# Patient Record
Sex: Male | Born: 1935 | Race: White | Hispanic: No | Marital: Married
Health system: Southern US, Community
[De-identification: ages and names within clinical notes are randomized; demographics above are authoritative.]

---

## 1998-07-14 ENCOUNTER — Other Ambulatory Visit: Admission: RE | Admit: 1998-07-14 | Discharge: 1998-07-14 | Payer: Self-pay | Admitting: *Deleted

## 1999-06-25 ENCOUNTER — Other Ambulatory Visit: Admission: RE | Admit: 1999-06-25 | Discharge: 1999-06-25 | Payer: Self-pay | Admitting: Urology

## 2002-01-11 ENCOUNTER — Ambulatory Visit (HOSPITAL_COMMUNITY): Admission: RE | Admit: 2002-01-11 | Discharge: 2002-01-12 | Payer: Self-pay | Admitting: Cardiology

## 2002-01-12 ENCOUNTER — Encounter: Payer: Self-pay | Admitting: Cardiology

## 2003-01-13 ENCOUNTER — Ambulatory Visit (HOSPITAL_COMMUNITY): Admission: RE | Admit: 2003-01-13 | Discharge: 2003-01-13 | Payer: Self-pay | Admitting: Neurology

## 2003-02-05 ENCOUNTER — Encounter (INDEPENDENT_AMBULATORY_CARE_PROVIDER_SITE_OTHER): Payer: Self-pay | Admitting: Specialist

## 2003-02-05 ENCOUNTER — Ambulatory Visit (HOSPITAL_COMMUNITY): Admission: RE | Admit: 2003-02-05 | Discharge: 2003-02-05 | Payer: Self-pay | Admitting: *Deleted

## 2005-01-10 ENCOUNTER — Encounter (INDEPENDENT_AMBULATORY_CARE_PROVIDER_SITE_OTHER): Payer: Self-pay | Admitting: Specialist

## 2005-01-10 ENCOUNTER — Ambulatory Visit (HOSPITAL_COMMUNITY): Admission: RE | Admit: 2005-01-10 | Discharge: 2005-01-10 | Payer: Self-pay | Admitting: Urology

## 2005-01-10 ENCOUNTER — Ambulatory Visit (HOSPITAL_BASED_OUTPATIENT_CLINIC_OR_DEPARTMENT_OTHER): Admission: RE | Admit: 2005-01-10 | Discharge: 2005-01-10 | Payer: Self-pay | Admitting: Urology

## 2005-09-15 ENCOUNTER — Emergency Department (HOSPITAL_COMMUNITY): Admission: EM | Admit: 2005-09-15 | Discharge: 2005-09-16 | Payer: Self-pay | Admitting: Emergency Medicine

## 2005-12-30 ENCOUNTER — Emergency Department (HOSPITAL_COMMUNITY): Admission: EM | Admit: 2005-12-30 | Discharge: 2005-12-30 | Payer: Self-pay | Admitting: Emergency Medicine

## 2009-07-16 ENCOUNTER — Ambulatory Visit (HOSPITAL_COMMUNITY): Admission: RE | Admit: 2009-07-16 | Discharge: 2009-07-16 | Payer: Self-pay | Admitting: Cardiology

## 2010-01-01 ENCOUNTER — Encounter: Admission: RE | Admit: 2010-01-01 | Discharge: 2010-01-01 | Payer: Self-pay | Admitting: Family Medicine

## 2010-01-04 ENCOUNTER — Ambulatory Visit: Payer: Self-pay | Admitting: Oncology

## 2010-01-07 LAB — CBC WITH DIFFERENTIAL/PLATELET
Basophils Absolute: 0 10*3/uL (ref 0.0–0.1)
EOS%: 3.7 % (ref 0.0–7.0)
Eosinophils Absolute: 0.1 10*3/uL (ref 0.0–0.5)
HGB: 12.8 g/dL — ABNORMAL LOW (ref 13.0–17.1)
LYMPH%: 16.7 % (ref 14.0–49.0)
MONO#: 0.5 10*3/uL (ref 0.1–0.9)
NEUT#: 2.6 10*3/uL (ref 1.5–6.5)
NEUT%: 65.2 % (ref 39.0–75.0)
RDW: 12.3 % (ref 11.0–14.6)
WBC: 4 10*3/uL (ref 4.0–10.3)

## 2010-01-07 LAB — CHCC SMEAR

## 2010-01-11 LAB — COMPREHENSIVE METABOLIC PANEL
Alkaline Phosphatase: 110 U/L (ref 39–117)
Creatinine, Ser: 0.89 mg/dL (ref 0.40–1.50)
Potassium: 3.9 mEq/L (ref 3.5–5.3)
Sodium: 139 mEq/L (ref 135–145)
Total Bilirubin: 0.6 mg/dL (ref 0.3–1.2)
Total Protein: 6.2 g/dL (ref 6.0–8.3)

## 2010-01-11 LAB — SPEP & IFE WITH QIG
Albumin ELP: 53.3 % — ABNORMAL LOW (ref 55.8–66.1)
Alpha-1-Globulin: 6.8 % — ABNORMAL HIGH (ref 2.9–4.9)
Beta 2: 5.7 % (ref 3.2–6.5)
Gamma Globulin: 13.2 % (ref 11.1–18.8)

## 2010-01-11 LAB — LACTATE DEHYDROGENASE: LDH: 340 U/L — ABNORMAL HIGH (ref 94–250)

## 2010-01-15 ENCOUNTER — Ambulatory Visit (HOSPITAL_COMMUNITY): Admission: RE | Admit: 2010-01-15 | Discharge: 2010-01-15 | Payer: Self-pay | Admitting: Oncology

## 2010-02-22 ENCOUNTER — Ambulatory Visit (HOSPITAL_COMMUNITY): Admission: RE | Admit: 2010-02-22 | Discharge: 2010-02-22 | Payer: Self-pay | Admitting: Oncology

## 2010-02-22 ENCOUNTER — Ambulatory Visit: Payer: Self-pay | Admitting: Oncology

## 2010-02-25 ENCOUNTER — Ambulatory Visit: Payer: Self-pay | Admitting: Oncology

## 2010-04-12 ENCOUNTER — Encounter (INDEPENDENT_AMBULATORY_CARE_PROVIDER_SITE_OTHER): Payer: Self-pay | Admitting: Surgery

## 2010-04-12 ENCOUNTER — Inpatient Hospital Stay (HOSPITAL_COMMUNITY): Admission: RE | Admit: 2010-04-12 | Discharge: 2010-04-12 | Payer: Self-pay | Admitting: Surgery

## 2010-04-14 ENCOUNTER — Ambulatory Visit: Payer: Self-pay | Admitting: Oncology

## 2010-05-26 ENCOUNTER — Ambulatory Visit: Payer: Self-pay | Admitting: Cardiology

## 2010-06-03 ENCOUNTER — Ambulatory Visit: Payer: Self-pay | Admitting: Oncology

## 2010-06-08 LAB — CBC WITH DIFFERENTIAL/PLATELET
BASO%: 0.4 % (ref 0.0–2.0)
Basophils Absolute: 0 10*3/uL (ref 0.0–0.1)
EOS%: 0.6 % (ref 0.0–7.0)
HCT: 39.5 % (ref 38.4–49.9)
MCH: 29.9 pg (ref 27.2–33.4)
MCV: 90.9 fL (ref 79.3–98.0)
MONO#: 0.4 10*3/uL (ref 0.1–0.9)
MONO%: 5.7 % (ref 0.0–14.0)
lymph#: 1.4 10*3/uL (ref 0.9–3.3)

## 2010-08-03 DEATH — deceased

## 2010-09-29 ENCOUNTER — Ambulatory Visit: Payer: Self-pay | Admitting: Internal Medicine

## 2010-12-19 LAB — TYPE AND SCREEN

## 2010-12-19 LAB — COMPREHENSIVE METABOLIC PANEL
ALT: 29 U/L (ref 0–53)
AST: 67 U/L — ABNORMAL HIGH (ref 0–37)
GFR calc non Af Amer: >60 mL/min (ref >60)
Potassium: 4.4 mEq/L (ref 3.5–5.1)
Total Protein: 6 g/dL (ref 6.0–8.3)

## 2010-12-19 LAB — DIFFERENTIAL
Basophils Absolute: 0 10*3/uL (ref 0.0–0.1)
Basophils Relative: 1 % (ref 0–1)
Eosinophils Absolute: 0 10*3/uL (ref 0.0–0.7)
Eosinophils Relative: 1 % (ref 0–5)

## 2010-12-19 LAB — PROTIME-INR
INR: 1.43 (ref 0.00–1.49)
Prothrombin Time: 17.3 seconds — ABNORMAL HIGH (ref 11.6–15.2)

## 2010-12-19 LAB — CBC
HCT: 40.8 % (ref 39.0–52.0)
MCHC: 32.9 g/dL (ref 30.0–36.0)
Platelets: 216 10*3/uL (ref 150–400)
RBC: 4.61 MIL/uL (ref 4.22–5.81)
RDW: 16.4 % — ABNORMAL HIGH (ref 11.5–15.5)
WBC: 6.4 10*3/uL (ref 4.0–10.5)

## 2010-12-19 LAB — SURGICAL PCR SCREEN: MRSA, PCR: NEGATIVE

## 2010-12-19 LAB — APTT: aPTT: 33 seconds (ref 24–37)

## 2010-12-20 LAB — DIFFERENTIAL
Basophils Relative: 1 % (ref 0–1)
Eosinophils Absolute: 0.1 10*3/uL (ref 0.0–0.7)
Monocytes Absolute: 0.9 10*3/uL (ref 0.1–1.0)
Neutrophils Relative %: 74 % (ref 43–77)

## 2010-12-20 LAB — BONE MARROW EXAM

## 2010-12-20 LAB — CBC
MCHC: 32.8 g/dL (ref 30.0–36.0)
WBC: 5.6 10*3/uL (ref 4.0–10.5)

## 2011-02-18 NOTE — Discharge Summary (Signed)
. Shawn Atkinson  Patient:    Shawn Atkinson, Shawn Atkinson Visit Number: [id number] MRN: [id number]          Service Type: CAT Location: 4700 [id number] Attending Physician:  Shawn Atkinson Dictated by:   Shawn Atkinson, R.N., A.N.P. Admit Date:  01/11/2002 Disc. Date: 01/12/02   CC:         Shawn Atkinson, M.D.  Shawn Atkinson, M.D.   Discharge Summary  PRIMARY DISCHARGE DIAGNOSIS:  Tachybrady syndrome with subsequent implantation of a Medtronics single chamber pacemaker, model [id number], Serial [id number].  SECONDARY DISCHARGE DIAGNOSES: 1. Chronic atrial fibrillation. 2. Chronic Coumadin. 3. Hypercholesterolemia.  HISTORY OF PRESENT ILLNESS:  The patient is a pleasant 77 year old white male who has had recently documented tachybrady syndrome.  He has had some fatigue with exertion and a gradual progressive shortness of breath with exertion as well.  He had a stress Cardiolite study performed earlier this month in which no ischemia was identified, however, he did have documented tachybrady syndrome and was subsequently referred on for permanent transvenous pacemaker implantation.  Please see the dictated history and physical for further patient presentation and profiling.  LABORATORY DATA:  INR:  1.5.  CHEMISTRIES:  Normal.  CBC:  Within normal limits as well.  Atkinson COURSE:  The patient was admitted from the short stay center in order to undergo elective implantation of a permanent transvenous pacemaker.  A single chamber pacemaker Medtronics model [id number], serial number [id number] was implanted.  The procedure was tolerated very well without any known complications.  He was subsequently transferred to 4700 and plans will now be made for the patient to be discharged in the morning if deemed stable on morning rounds.  DISCHARGE CONDITION:  Stable.  DISCHARGE MEDICATIONS:  Patient will resume his previous home medications which  include: 1. Lipitor 2. Coumadin 3. Current Medrol DosePak. 4. Will add Toprol XL 25 mg q.d. 4. Will also add Lanoxin 0.125 mg q.d.  WOUND CARE:  Extensive written instructions are given regarding pacemaker care.  He is specifically not to raise his right arm above his head for the next one to two weeks.  He is to avoid getting his wound wet for the first week as well and to paint the site daily with Betadine swabs times three days.  FOLLOW UP:  We will have the patient follow up in the office in approximately 10 days or sooner if problems would arise. Dictated by:   Shawn Atkinson, R.N., A.N.P. Attending Physician:  Shawn Atkinson DD:  01/11/02 TD:  01/13/02 Job: [id number]

## 2011-02-18 NOTE — Procedures (Signed)
[place]  Patient:    SAYAN, ALDAVA Visit Number: [id number] MRN: [id number]          Service Type: CAT Location: [facility] 2852 01 Attending Physician:  Eleanora Neighbor Dictated by:   Colleen Can Deborah Chalk, M.D. Proc. Date: 01/11/02 Admit Date:  01/11/2002   CC:         [name], M.D.   Procedure Report  HISTORY:  Mr. Tassin has chronic atrial fibrillation.  He had periods of bradycardia and with exercise had tachycardia-induced ventricular tachycardia. With that it was felt that we needed to proceed on with pacing to prevent the profound bradycardia as well as rate control.  PROCEDURE:  Implantation of a single chamber pulse generator with a right ventricular lead done under fluoroscopy.  DESCRIPTION OF PROCEDURE:  The right subclavicular area was prepped and draped.  A subcutaneous pocket was created to the prepectoral fascia.  A single puncture was made over the top of the first rib.  A #7 French Cook introducer was used to introduce a [employer] , 52-cm, bipolar intracardial lead, serial number [id number].  Satisfactory thresholds were obtained.  The thresholds were 0.3 volts to capture at 0.5 msec pulse width, 470 ohms impedance, 6.2 mV R waves, and 0.8 mA current.  The lead was sutured in place.  The wound was flushed with kanamycin solution.  The lead was connected to a Medtronic Kappa [id number], serial number [id number].  The unit was sutured in place.  The balloon was then closed with 2-0, and subsequently 5-0, Dexon and Steri-Strips were applied.  Overall the patient tolerated the procedure well. Dictated by:   Colleen Can Deborah Chalk, M.D. Attending Physician:  Eleanora Neighbor DD:  01/11/02 TD:  01/11/02 Job: [id number]

## 2011-02-18 NOTE — H&P (Signed)
[place]  Patient:    Shawn Atkinson, Shawn Atkinson Visit Number: [id number] MRN: [id number]          Service Type: CAT Location: 4700 4708 [id number] Attending Physician:  Eleanora Neighbor Dictated by:   Jennet Maduro Earl Gala, R.N., A.N.P.-C. Admit Date:  01/11/2002 Discharge Date: 01/12/2002   CC:         [name], M.D.   History and Physical  DATE OF BIRTH:  Apr 17, 1931  CHIEF COMPLAINT:  None.  HISTORY OF PRESENT ILLNESS:  Mr. Lapoint is a very pleasant 77 year old white male who is referred for elective permanent transvenous pacemaker implantation.  He has had chronic atrial fibrillation and remains on chronic Coumadin therapy.  He has basically done well from a cardiac standpoint. However, he has begun to note gradual progressive shortness of breath, especially with walking up hills.  A stress Cardiolite study was performed on January 01, 2002.  With that study, no ischemia was identified; however, he was noted to have tachybrady syndrome.  He is now referred for elective permanent transvenous pacemaker implantation.  PAST MEDICAL HISTORY: 1. Atrial fibrillation. 2. Chronic Coumadin.  ALLERGIES:  None.  CURRENT MEDICATIONS: 1. Coumadin 5 mg x4, 2.5 mg x3. 2. Lipitor 10 mg a day.  FAMILY HISTORY:  Both of his parents are deceased.  Father died at age 12 due to aneurysm and had a history of mental disease.  Mother died at 70 with heart disease and stroke.  SOCIAL HISTORY:  He is married.  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  There is no alcohol or tobacco.  REVIEW OF SYSTEMS:  Basically as stated above and is otherwise unremarkable.  PHYSICAL EXAMINATION:  GENERAL:  He is a very pleasant white male who appears younger than his stated age.  VITAL SIGNS:  Blood pressure 100/70 sitting and standing, heart rate 56 and irregular, respirations 18.  He is afebrile.  SKIN:  Warm and dry.  Color is unremarkable.  NECK:   Supple, no JVD.  LUNGS:  Clear.  HEART:  Shows an irregularly irregular rhythm.  ABDOMEN:  Soft, positive bowel sounds, nontender.  EXTREMITIES:  Without edema.  NEUROLOGIC:  Intact.  LABORATORY DATA:  Pending.  IMPRESSION: 1. Tachybrady syndrome. 2. Atrial fibrillation. 3. Chronic Coumadin therapy. 4. Hypercholesterolemia.  PLAN:  Will proceed on with elective permanent transvenous pacemaker implantation.  His Coumadin has been discontinued as of April 7.  The procedure has been discussed in full detail with both he and his wife and he is willing to proceed. Dictated by:   Jennet Maduro Earl Gala, R.N., A.N.P.-C. Attending Physician:  Eleanora Neighbor DD:  01/09/02 TD:  01/09/02 Job: [id number]

## 2011-02-18 NOTE — Op Note (Signed)
NAME:  Shawn Atkinson, Shawn Atkinson           ACCOUNT NO.:  [id number]   MEDICAL RECORD NO.:  [id number]          PATIENT TYPE:  AMB   LOCATION:  [facility]                         FACILITY:  [facility]   PHYSICIAN:  Maretta Bees. Vonita Moss, M.D.DATE OF BIRTH:  11/12/1930   DATE OF PROCEDURE:  01/10/2005  DATE OF DISCHARGE:                                 OPERATIVE REPORT   PREOPERATIVE DIAGNOSIS:  Microhematuria.   POSTOPERATIVE DIAGNOSIS:  Microhematuria.   PROCEDURE:  Cystoscopy, bilateral retrograde pyelograms with interpretation.  Cold cup bladder biopsies and fulguration of prostate.   SURGEON:  Dr. Larey Dresser   ANESTHESIA:  General.   INDICATIONS:  This 77 year old gentleman has had some persistent  microhematuria and had a renal ultrasound in the past that showed no masses,  stones or hydronephrosis.  He has had a couple of cystoscopies and the last  cysto on March 17 showed partial obstruction of the prostate and some tiny  hemorrhagic areas in all four quadrants of the bladder and NMP-22 and urine  cytologies were negative, but he is brought the OR today for bladder biopsy  to rule out CIS and also to do retrograde pyelograms.   PROCEDURE:  The patient brought to the operating room, placed in lithotomy  position. External genitalia were prepped, draped usual fashion. He was  cystoscoped and the anterior urethra was normal. The prostatic urethra had  bilobar hypertrophy and a median lobe. Bladder had trabeculation and there  were some mildly erythematous areas in all four quadrants of the bladder.   Retrograde pyelograms were obtained using a cone-tipped ureteral catheter  and there was distal J hooking and fullness of the distal ureters due to BPH  and the pyelocaliceal systems were basically non-obstructed and without  filling defects. There was some caliceal backflow especially on the left  during the retrograde. Cold cup bladder biopsies were then taken from right  and left walls in two  areas in the dome as representative random biopsies.  The biopsy sites were fulgurated. After the bladder was emptied, scope  removed. He had some persistent blood per urethra and I rescoped him and  there was some bleeding from some mucosal disruption of the very distal  prostate which I fulgurated the Bugbee electrode and left the Foley catheter  in which hopefully will just be left in an hour in the recovery room and  then removed satisfactorily. He is taken to recovery room otherwise in good  condition having tolerated the procedure well. Estimated blood loss 50 cc.      LJP/MEDQ  D:  01/10/2005  T:  01/10/2005  Job:  [id number]

## 2012-02-05 IMAGING — PT NM PET TUM IMG INITIAL (PI) SKULL BASE T - THIGH
7 series · 25 of 25 positions shown · non-contrast
Comparison: CT abdomen 01/01/2010

CLINICAL DATA: Initial  treatment strategy for lymphadenopathy,
lymphoma, and splenic mass.

NUCLEAR MEDICINE PET SKULL BASE TO THIGH
Fasting Blood Glucose:  98
TECHNIQUE: 18.1 mCi F-18 FDG was injected intravenously via the
right forearm.  Full-ring PET imaging was performed from the skull
base through the mid-thighs 110  minutes after injection.  CT data
was obtained and used for attenuation correction and anatomic
localization only.  (This was not acquired as a diagnostic CT
examination.)

[Series 1: pet ac · axial · 3.3mm · 4.69mm/px · z∈[-870,+0]mm · 5 of 267 slices shown]
[im 1/267]
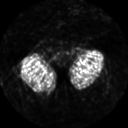
[im 67/267]
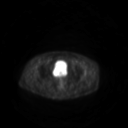
[im 134/267]
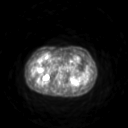
[im 200/267]
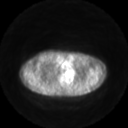
[im 267/267]
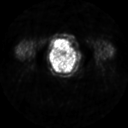

[Series 2: ct images · axial · 3.8mm · 0.98mm/px · z∈[-870,+0]mm · 5 of 267 slices shown]
[im 1/267]
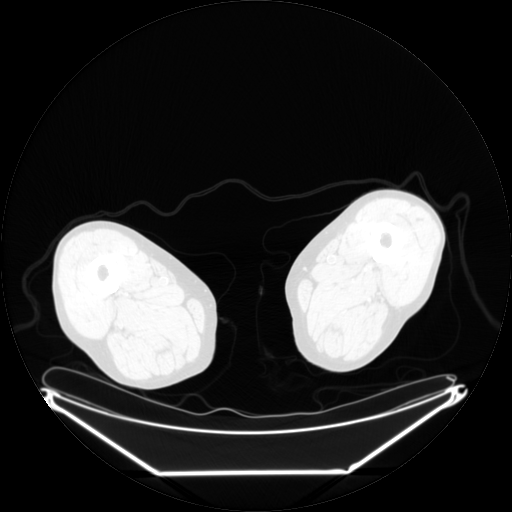
[im 67/267]
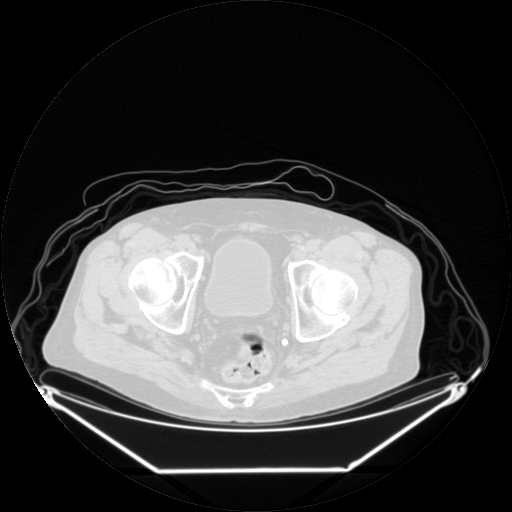
[im 134/267]
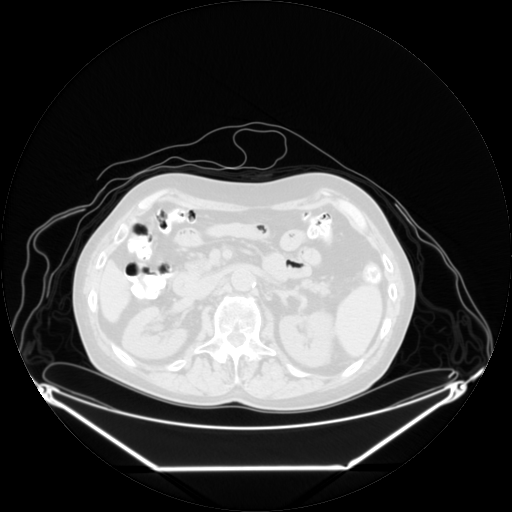
[im 200/267]
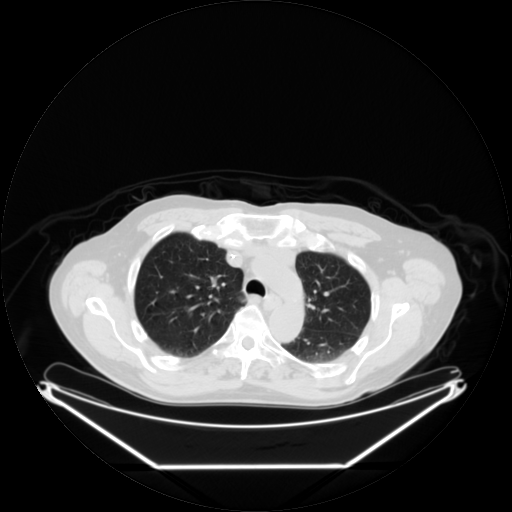
[im 267/267  brain]
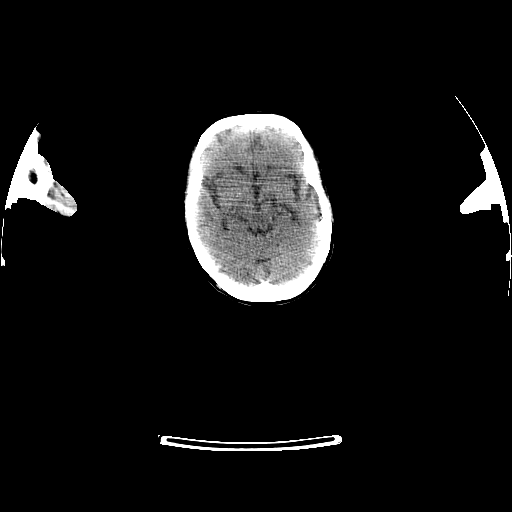

[Series 2: pet nac · axial · 3.3mm · 4.69mm/px · z∈[-870,+0]mm · 5 of 267 slices shown]
[im 1/267]
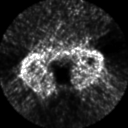
[im 67/267]
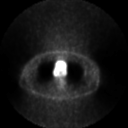
[im 134/267]
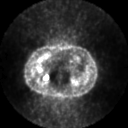
[im 200/267]
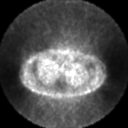
[im 267/267]
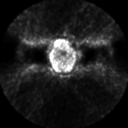

[Series 123: mip · coronal · 3.3mm · 4.69mm/px · 1 of 30 slices shown]
[im 1/30]
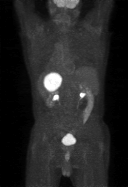

[Series 150: reformatted · axial · 3.3mm · 0.98mm/px · 1 of 1 slices shown (1 of 3)]
[im 1/1]
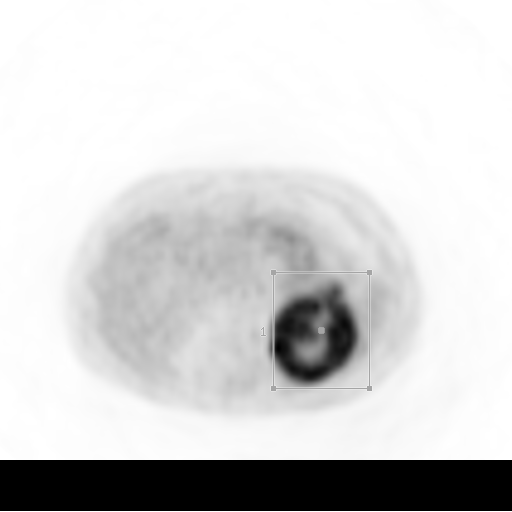

[Series 151: reformatted · axial · 3.3mm · 3.91mm/px · z∈[-870,+0]mm · 6 of 267 slices shown (2 of 3)]
[im 1/267]
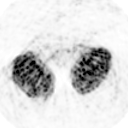
[im 54/267]
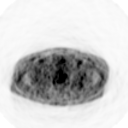
[im 107/267]
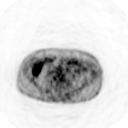
[im 160/267]
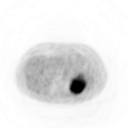
[im 213/267]
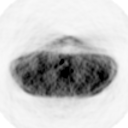
[im 267/267]
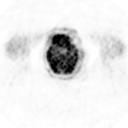

[Series 153: reformatted · coronal · 4.7mm · 6.98mm/px · 2 of 73 slices shown (3 of 3)]
[im 1/73]
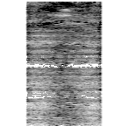
[im 73/73]
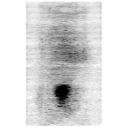

[25 of 25 positions shown; findings below may reference images not displayed]

FINDINGS: Neck:No hypermetabolic nodes in the neck.

Chest:  No hypermetabolic mediastinal or hilar nodes.  No
suspicious pulmonary nodules. There is mild activity associated
with esophagus which may represent esophagitis.

Abdomen / Pelvis:There is intense hypermetabolic activity
associated with the large splenic mass.  Mass measures 7.8 x 8.3 cm
with SUV max = 42 and is most consistent with lymphoma.  Small
rounded lesions adjacent to the pancreatic tail and adjacent to the
splenic hilum also have hypermetabolic activity with SUV max =
(image 123 for example).  These smaller, likely nodal ,masses are
better depicted on the contrast enhanced CT.  Some of these nodes
have a peripheral hypermetabolic pattern suggesting necrosis (image
127).

There is no additional retroperitoneal or periportal
lymphadenopathy.  No abnormal uptake within the liver.  No
hypermetabolic pelvic lymph nodes. Hypermetabolic activity within
the testicles is felt to be physiologic.

Skeleton:No focal hypermetabolic activity to suggest skeletal
metastasis.
IMPRESSION: 1.  Intense hypermetabolic activity within splenic mass is most
consistent with splenic lymphoma.
2.  Hypermetabolic activity associated with smaller
peripancreatic/perisplenic lymph nodes is also most consistent with
lymphoma.

3.  No additional evidence of lymphadenopathy within the chest,
abdomen, pelvis, or skeleton.

## 2022-05-27 NOTE — Progress Notes (Signed)
Erroneous encounter
